# Patient Record
Sex: Male | Born: 2000 | Race: Black or African American | Hispanic: No | Marital: Single | Smoking: Never smoker
Health system: Southern US, Community
[De-identification: ages and names within clinical notes are randomized; demographics above are authoritative.]

## PROBLEM LIST (undated history)

## (undated) DIAGNOSIS — J45909 Unspecified asthma, uncomplicated: Secondary | ICD-10-CM

## (undated) DIAGNOSIS — T7840XA Allergy, unspecified, initial encounter: Secondary | ICD-10-CM

## (undated) HISTORY — PX: OTHER SURGICAL HISTORY: SHX169

---
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
## 2018-06-02 ENCOUNTER — Ambulatory Visit (HOSPITAL_COMMUNITY)
Admission: EM | Admit: 2018-06-02 | Discharge: 2018-06-02 | Disposition: A | Discharge status: Home / Self Care | Payer: BLUE CROSS/BLUE SHIELD | Attending: Family Medicine | Admitting: Family Medicine

## 2018-06-02 ENCOUNTER — Encounter (HOSPITAL_COMMUNITY): Payer: Self-pay

## 2018-06-02 DIAGNOSIS — R079 Chest pain, unspecified: Secondary | ICD-10-CM

## 2018-06-02 MED ORDER — IBUPROFEN 600 MG PO TABS: 600.0000 mg | ORAL_TABLET | Freq: Four times a day (QID) | ORAL | 0 refills | Status: DC | PRN

## 2018-06-02 NOTE — ED Triage Notes (Signed)
Pt presents with left side chest pain not associated with anything in particular.

## 2018-06-02 NOTE — Discharge Instructions (Addendum)
Your EKG was normal Exam normal.  No concerning signs or symptoms. Vital signs were normal I believe this could be a deep muscle pain. Ibuprofen 600 mg every 6 hours as needed Follow up as needed for continued or worsening symptoms

## 2018-06-03 NOTE — ED Provider Notes (Signed)
[facility]    CSN: [id number] Arrival date & time: 06/02/18  1508     History   Chief Complaint Chief Complaint  Patient presents with  . Chest Pain    Left    HPI Jeffrey Drake is a 17 y.o. male.   Patient is a 17 year old male past medical history of allergies and asthma.  He presents with approximately 2 days of left-sided chest pain.  The pain is waxing and waning.  He describes it as needles in his chest.  The pain is worse is about a 5 out of 10.  He denies any associated shortness of breath, cough, congestion, fevers.  He does have a history of asthma but has not had problems with this long time.  He is not currently using inhaler.  He denies any injury to the chest, strenuous activity, heavy lifting.  He denies any stress or anxiety.  The pain is not reproducible.  The pain is not affected by breathing.  He denies any nausea, vomiting or diaphoresis. He does not smoke.  No recent traveling.  He denies any history of PE.  ROS per HPI       Past Medical History:  Diagnosis Date  . Allergy   . Asthma     Patient Active Problem List   Diagnosis Date Noted  . Right tibial fracture 02/18/2015  . Tibial plateau fracture 04/15/2014    Past Surgical History:  Procedure Laterality Date  . OPEN REDUCTION INTERNAL FIXATION (ORIF) TIBIAL TUBERCLE Right 02/18/2015   Procedure: OPEN REDUCTION INTERNAL FIXATION (ORIF) TIBIAL TUBERCLE;  Surgeon: Eldred MangesMark C Yates, MD;  Location: [facility];  Service: Orthopedics;  Laterality: Right;  . testicular torsion     Mother says 2-3 weeks ago surgury on scrotum        Home Medications    Prior to Admission medications   Medication Sig Start Date End Date Taking? Authorizing Provider  albuterol (PROVENTIL HFA;VENTOLIN HFA) 108 (90 BASE) MCG/ACT inhaler Inhale 2 puffs into the lungs every 4 (four) hours as needed for wheezing or shortness of breath.    [provider]  ibuprofen (ADVIL,MOTRIN) 600 MG tablet Take 1  tablet (600 mg total) by mouth every 6 (six) hours as needed. 06/02/18   Dahlia ByesBast, Rivaldo Hineman A, NP  oxyCODONE-acetaminophen (ROXICET) 5-325 MG per tablet Take 1 tablet by mouth every 6 (six) hours as needed for severe pain. 02/20/15   Naida Sleightwens, James M, PA-C    Family History Family History  Problem Relation Age of Onset  . Asthma Mother   . Asthma Maternal Grandmother   . Diabetes Maternal Grandmother   . Hypertension Maternal Grandmother     Social History Social History   Tobacco Use  . Smoking status: Never Smoker  . Smokeless tobacco: Never Used  Substance Use Topics  . Alcohol use: No  . Drug use: No     Allergies   Shellfish allergy   Review of Systems Review of Systems   Physical Exam Triage Vital Signs ED Triage Vitals  Enc Vitals Group     BP 06/02/18 1614 (!) 123/55     Pulse Rate 06/02/18 1614 91     Resp 06/02/18 1614 18     Temp 06/02/18 1614 98.5 F (36.9 C)     Temp Source 06/02/18 1614 Oral     SpO2 06/02/18 1614 99 %     Weight --      Height --      Head Circumference --  Peak Flow --      Pain Score 06/02/18 1613 5     Pain Loc --      Pain Edu? --      Excl. in GC? --    No data found.  Updated Vital Signs BP (!) 123/55 (BP Location: Right Arm)   Pulse 91   Temp 98.5 F (36.9 C) (Oral)   Resp 18   SpO2 99%   Visual Acuity Right Eye Distance:   Left Eye Distance:   Bilateral Distance:    Right Eye Near:   Left Eye Near:    Bilateral Near:     Physical Exam  Constitutional: He is oriented to person, place, and time. He appears well-developed and well-nourished.  Non-toxic appearance. He does not appear ill.  HENT:  Head: Normocephalic and atraumatic.  Neck: Normal range of motion.  Cardiovascular: Normal rate, regular rhythm and normal pulses. Exam reveals no S3 and no S4.  No murmur heard. Pulmonary/Chest: Effort normal and breath sounds normal. No respiratory distress.  Musculoskeletal: Normal range of motion.       Right  lower leg: Normal.       Left lower leg: Normal.  Neurological: He is alert and oriented to person, place, and time.  Skin: Skin is warm and dry.  Psychiatric: He has a normal mood and affect.  Nursing note and vitals reviewed.    UC Treatments / Results  Labs (all labs ordered are listed, but only abnormal results are displayed) Labs Reviewed - No data to display  EKG None  Radiology No results found.  Procedures Procedures (including critical care time)  Medications Ordered in UC Medications - No data to display  Initial Impression / Assessment and Plan / UC Course  I have reviewed the triage vital signs and the nursing notes.  Pertinent labs & imaging results that were available during my care of the patient were reviewed by me and considered in my medical decision making (see chart for details).     Patient is a 17 year old male presents for left-sided chest pain that has been waxing and waning of the last 3 days. No concerning signs or symptoms on exam.  No concern for PE, cardiac arrhythmia, bronchitis, pneumonia.  He is otherwise healthy. EKG revealed normal sinus rhythm with normal rate. This is most likely a deep muscular skeletal pain Instructed to try ibuprofen 600 mg every 6 hours as needed For continued or worsening symptoms follow-up with primary care or go to the ER Final Clinical Impressions(s) / UC Diagnoses   Final diagnoses:  Nonspecific chest pain     Discharge Instructions     Your EKG was normal Exam normal.  No concerning signs or symptoms. Vital signs were normal I believe this could be a deep muscle pain. Ibuprofen 600 mg every 6 hours as needed Follow up as needed for continued or worsening symptoms     ED Prescriptions    Medication Sig Dispense Auth. Provider   ibuprofen (ADVIL,MOTRIN) 600 MG tablet Take 1 tablet (600 mg total) by mouth every 6 (six) hours as needed. 30 tablet Dahlia Byes A, NP     Controlled Substance  Prescriptions [place] Controlled Substance Registry consulted? Not Applicable   Janace Aris, NP 06/03/18 1027

## 2019-08-08 ENCOUNTER — Ambulatory Visit: Payer: Managed Care, Other (non HMO) | Attending: Internal Medicine

## 2019-08-08 DIAGNOSIS — Z20822 Contact with and (suspected) exposure to covid-19: Secondary | ICD-10-CM

## 2019-08-09 LAB — NOVEL CORONAVIRUS, NAA: SARS-CoV-2, NAA: DETECTED — AB

## 2019-08-15 ENCOUNTER — Ambulatory Visit: Payer: Managed Care, Other (non HMO) | Attending: Internal Medicine

## 2019-08-16 ENCOUNTER — Ambulatory Visit: Payer: Managed Care, Other (non HMO) | Attending: Internal Medicine

## 2019-08-16 DIAGNOSIS — Z20822 Contact with and (suspected) exposure to covid-19: Secondary | ICD-10-CM

## 2019-08-17 LAB — NOVEL CORONAVIRUS, NAA: SARS-CoV-2, NAA: NOT DETECTED

## 2019-12-14 ENCOUNTER — Emergency Department (HOSPITAL_COMMUNITY)
Admission: EM | Admit: 2019-12-14 | Discharge: 2019-12-14 | Disposition: A | Discharge status: Home / Self Care | Payer: Managed Care, Other (non HMO) | Attending: Emergency Medicine | Admitting: Emergency Medicine

## 2019-12-14 ENCOUNTER — Encounter (HOSPITAL_COMMUNITY): Payer: Self-pay | Admitting: Emergency Medicine

## 2019-12-14 DIAGNOSIS — K0401 Reversible pulpitis: Secondary | ICD-10-CM | POA: Insufficient documentation

## 2019-12-14 DIAGNOSIS — J45909 Unspecified asthma, uncomplicated: Secondary | ICD-10-CM | POA: Diagnosis not present

## 2019-12-14 DIAGNOSIS — K0889 Other specified disorders of teeth and supporting structures: Secondary | ICD-10-CM | POA: Diagnosis present

## 2019-12-14 MED ORDER — NAPROXEN 375 MG PO TABS: 375.0000 mg | ORAL_TABLET | Freq: Two times a day (BID) | ORAL | 0 refills | Status: DC

## 2019-12-14 MED ORDER — AMOXICILLIN 500 MG PO CAPS
500.0000 mg | ORAL_CAPSULE | Freq: Once | ORAL | Status: AC
  Administered 2019-12-14: 500 mg via ORAL
  Filled 2019-12-14: qty 1

## 2019-12-14 MED ORDER — IBUPROFEN 200 MG PO TABS
600.0000 mg | ORAL_TABLET | Freq: Once | ORAL | Status: AC
  Administered 2019-12-14: 600 mg via ORAL
  Filled 2019-12-14: qty 1

## 2019-12-14 MED ORDER — AMOXICILLIN 500 MG PO CAPS: 500.0000 mg | ORAL_CAPSULE | Freq: Two times a day (BID) | ORAL | 0 refills | Status: AC

## 2019-12-14 NOTE — ED Triage Notes (Signed)
Pt states for he has been having left sided dental pain for over 1 week made a dentist apt and they could not see him until sept. Pt has been using a numbing gel for pain.

## 2019-12-14 NOTE — ED Provider Notes (Signed)
[facility] EMERGENCY DEPARTMENT Provider Note   CSN: [id number] Arrival date & time: 12/14/19  0800     History Chief Complaint  Patient presents with  . Dental Pain    Jeffrey Drake is a 19 y.o. male.  HPI 19 year old male presents with left-sided dental pain.  Ongoing for 1-2 weeks.  Does not remember a specific injury or time that it exactly started.  No fevers, neck swelling, dental or facial swelling.  No trouble breathing.  Has tried aspirin and a topical cream which seemed to help at the first but now it is not really helping.  Called the dentist and has appointment set up for September.  Pain is both mandibular and maxillary.   Past Medical History:  Diagnosis Date  . Allergy   . Asthma     Patient Active Problem List   Diagnosis Date Noted  . Right tibial fracture 02/18/2015  . Tibial plateau fracture 04/15/2014    Past Surgical History:  Procedure Laterality Date  . OPEN REDUCTION INTERNAL FIXATION (ORIF) TIBIAL TUBERCLE Right 02/18/2015   Procedure: OPEN REDUCTION INTERNAL FIXATION (ORIF) TIBIAL TUBERCLE;  Surgeon: Eldred Manges, MD;  Location: [facility];  Service: Orthopedics;  Laterality: Right;  . testicular torsion     Mother says 2-3 weeks ago surgury on scrotum        Family History  Problem Relation Age of Onset  . Asthma Mother   . Asthma Maternal Grandmother   . Diabetes Maternal Grandmother   . Hypertension Maternal Grandmother     Social History   Tobacco Use  . Smoking status: Never Smoker  . Smokeless tobacco: Never Used  Substance Use Topics  . Alcohol use: No  . Drug use: No    Home Medications Prior to Admission medications   Medication Sig Start Date End Date Taking? Authorizing Provider  albuterol (PROVENTIL HFA;VENTOLIN HFA) 108 (90 BASE) MCG/ACT inhaler Inhale 2 puffs into the lungs every 4 (four) hours as needed for wheezing or shortness of breath.    [provider]  amoxicillin (AMOXIL) 500 MG  capsule Take 1 capsule (500 mg total) by mouth 2 (two) times daily for 7 days. 12/14/19 12/21/19  Pricilla Loveless, MD  ibuprofen (ADVIL,MOTRIN) 600 MG tablet Take 1 tablet (600 mg total) by mouth every 6 (six) hours as needed. 06/02/18   Dahlia Byes A, NP  naproxen (NAPROSYN) 375 MG tablet Take 1 tablet (375 mg total) by mouth 2 (two) times daily. 12/14/19   Pricilla Loveless, MD  oxyCODONE-acetaminophen (ROXICET) 5-325 MG per tablet Take 1 tablet by mouth every 6 (six) hours as needed for severe pain. 02/20/15   Naida Sleight, PA-C    Allergies    Shellfish allergy  Review of Systems   Review of Systems  Constitutional: Negative for fever.  HENT: Positive for dental problem.   Respiratory: Negative for shortness of breath.   Gastrointestinal: Negative for vomiting.  Musculoskeletal: Negative for neck pain and neck stiffness.    Physical Exam Updated Vital Signs BP 133/69 (BP Location: Left Arm)   Pulse 64   Temp 98.7 F (37.1 C) (Oral)   Resp 14   Ht 6\' 2"  (1.88 m)   Wt 102.1 kg   SpO2 100%   BMI 28.89 kg/m   Physical Exam Vitals and nursing note reviewed.  Constitutional:      General: He is not in acute distress.    Appearance: He is well-developed. He is not ill-appearing or diaphoretic.  HENT:     Head: Normocephalic and atraumatic.     Right Ear: External ear normal.     Left Ear: External ear normal.     Nose: Nose normal.     Mouth/Throat:      Comments: No obvious dental abscess or gingival swelling Eyes:     General:        Right eye: No discharge.        Left eye: No discharge.  Cardiovascular:     Rate and Rhythm: Normal rate and regular rhythm.     Heart sounds: Normal heart sounds.  Pulmonary:     Effort: Pulmonary effort is normal.     Breath sounds: Normal breath sounds.  Abdominal:     General: There is no distension.  Musculoskeletal:     Cervical back: Normal range of motion and neck supple. No rigidity or tenderness.  Skin:    General: Skin is  warm and dry.  Neurological:     Mental Status: He is alert.  Psychiatric:        Mood and Affect: Mood is not anxious.     ED Results / Procedures / Treatments   Labs (all labs ordered are listed, but only abnormal results are displayed) Labs Reviewed - No data to display  EKG None  Radiology No results found.  Procedures Procedures (including critical care time)  Medications Ordered in ED Medications  ibuprofen (ADVIL) tablet 600 mg (has no administration in time range)  amoxicillin (AMOXIL) capsule 500 mg (has no administration in time range)    ED Course  I have reviewed the triage vital signs and the nursing notes.  Pertinent labs & imaging results that were available during my care of the patient were reviewed by me and considered in my medical decision making (see chart for details).    MDM Rules/Calculators/A&P                      Given acute nature of his pain, will treat with antibiotics and anti-inflammatories.  Will refer to on-call dentistry to see if he can get an earlier appointment for what is probably pulpitis.  Otherwise, highly doubt deep space infection. Final Clinical Impression(s) / ED Diagnoses Final diagnoses:  Acute pulpitis    Rx / DC Orders ED Discharge Orders         Ordered    amoxicillin (AMOXIL) 500 MG capsule  2 times daily     12/14/19 0825    naproxen (NAPROSYN) 375 MG tablet  2 times daily     12/14/19 0825           Sherwood Gambler, MD 12/14/19 [phone]

## 2019-12-14 NOTE — Discharge Instructions (Signed)
If you develop fever, vomiting, neck or facial swelling, trouble breathing, or any other new/concerning symptoms then return to the ER for evaluation.  You are being prescribed Naproxen. Do not take Ibuprofen/Advil/Aleve/Motrin/Goody Powders/Naproxen/BC powders/Meloxicam/Diclofenac/Indomethacin and other Nonsteroidal anti-inflammatory medications

## 2021-05-07 ENCOUNTER — Emergency Department (HOSPITAL_BASED_OUTPATIENT_CLINIC_OR_DEPARTMENT_OTHER): Payer: No Typology Code available for payment source | Admitting: Radiology

## 2021-05-07 ENCOUNTER — Other Ambulatory Visit: Payer: Self-pay

## 2021-05-07 ENCOUNTER — Encounter (HOSPITAL_BASED_OUTPATIENT_CLINIC_OR_DEPARTMENT_OTHER): Payer: Self-pay

## 2021-05-07 DIAGNOSIS — S99922A Unspecified injury of left foot, initial encounter: Secondary | ICD-10-CM | POA: Diagnosis present

## 2021-05-07 DIAGNOSIS — X501XXA Overexertion from prolonged static or awkward postures, initial encounter: Secondary | ICD-10-CM | POA: Diagnosis not present

## 2021-05-07 DIAGNOSIS — J45909 Unspecified asthma, uncomplicated: Secondary | ICD-10-CM | POA: Insufficient documentation

## 2021-05-07 DIAGNOSIS — Y990 Civilian activity done for income or pay: Secondary | ICD-10-CM | POA: Diagnosis not present

## 2021-05-07 DIAGNOSIS — S99929A Unspecified injury of unspecified foot, initial encounter: Secondary | ICD-10-CM

## 2021-05-07 DIAGNOSIS — S93402A Sprain of unspecified ligament of left ankle, initial encounter: Secondary | ICD-10-CM | POA: Insufficient documentation

## 2021-05-07 NOTE — ED Triage Notes (Signed)
Patient BIB [facility] from Work Site with Left Ankle Injury.  Patient was at Work when he rolled his Left Ankle on a Pallet.   Patient has Pain mainly to Medial Ankle. Pulses Palpable. NAD noted during Triage. A&Ox4. GCS 15. BIB Wheelchair.

## 2021-05-08 ENCOUNTER — Emergency Department (HOSPITAL_BASED_OUTPATIENT_CLINIC_OR_DEPARTMENT_OTHER)
Admission: EM | Admit: 2021-05-08 | Discharge: 2021-05-08 | Disposition: A | Discharge status: Home / Self Care | Payer: No Typology Code available for payment source | Attending: Emergency Medicine | Admitting: Emergency Medicine

## 2021-05-08 DIAGNOSIS — S99929A Unspecified injury of unspecified foot, initial encounter: Secondary | ICD-10-CM

## 2021-05-08 DIAGNOSIS — S93402A Sprain of unspecified ligament of left ankle, initial encounter: Secondary | ICD-10-CM

## 2021-05-08 MED ORDER — NAPROXEN 250 MG PO TABS
500.0000 mg | ORAL_TABLET | Freq: Once | ORAL | Status: AC
  Administered 2021-05-08: 500 mg via ORAL
  Filled 2021-05-08: qty 2

## 2021-05-08 MED ORDER — NAPROXEN 375 MG PO TABS: ORAL_TABLET | ORAL | 0 refills | Status: DC

## 2021-05-08 NOTE — ED Provider Notes (Signed)
[facility] EMERGENC[facility] Provider Note: Lowella Dell, MD, FACEP  CSN: [id number] MRN: [id number] ARRIVAL: 05/07/21 at 2156 ROOM: DB007/DB007   CHIEF COMPLAINT  Ankle Injury   HISTORY OF PRESENT ILLNESS  05/08/21 4:24 AM Jeffrey Drake is a 20 y.o. male who was at work yesterday evening about 10 PM.  He stepped on a pallet and it broke causing him to invert his left ankle.  He is not having pain over the lateral malleolus of the left ankle.  He rates his pain as a 6 out of 10, worse with movement or palpation.   Past Medical History:  Diagnosis Date   Allergy    Asthma     Past Surgical History:  Procedure Laterality Date   OPEN REDUCTION INTERNAL FIXATION (ORIF) TIBIAL TUBERCLE Right 02/18/2015   Procedure: OPEN REDUCTION INTERNAL FIXATION (ORIF) TIBIAL TUBERCLE;  Surgeon: Eldred Manges, MD;  Location: [facility];  Service: Orthopedics;  Laterality: Right;   testicular torsion     Mother says 2-3 weeks ago surgury on scrotum     Family History  Problem Relation Age of Onset   Asthma Mother    Asthma Maternal Grandmother    Diabetes Maternal Grandmother    Hypertension Maternal Grandmother     Social History   Tobacco Use   Smoking status: Never   Smokeless tobacco: Never  Substance Use Topics   Alcohol use: No   Drug use: No    Prior to Admission medications   Medication Sig Start Date End Date Taking? Authorizing Provider  albuterol (PROVENTIL HFA;VENTOLIN HFA) 108 (90 BASE) MCG/ACT inhaler Inhale 2 puffs into the lungs every 4 (four) hours as needed for wheezing or shortness of breath.    [provider]  naproxen (NAPROSYN) 375 MG tablet Take 1 tablet twice daily as needed for pain. 05/08/21   Vandell Kun, MD    Allergies Shellfish allergy   REVIEW OF SYSTEMS  Negative except as noted here or in the History of Present Illness.   PHYSICAL EXAMINATION  Initial Vital Signs Blood pressure (!) 154/63, pulse 80, temperature 98.3 F (36.8 C), temperature  source Oral, resp. rate 16, height 6\' 2"  (1.88 m), weight 93 kg, SpO2 100 %.  Examination General: Well-developed, well-nourished male in no acute distress; appearance consistent with age of record HENT: normocephalic; atraumatic Eyes: Normal appearance Neck: supple Heart: regular rate and rhythm Lungs: clear to auscultation bilaterally Abdomen: soft; nondistended; nontender; bowel sounds present Extremities: No deformity; swelling and tenderness over left lateral malleolus, normal pulses, foot distally neurovascularly intact with intact tendon function Neurologic: Awake, alert and oriented; motor function intact in all extremities and symmetric; no facial droop Skin: Warm and dry Psychiatric: Normal mood and affect   RESULTS  Summary of this visit's results, reviewed and interpreted by myself:   EKG Interpretation  Date/Time:    Ventricular Rate:    PR Interval:    QRS Duration:   QT Interval:    QTC Calculation:   R Axis:     Text Interpretation:         Laboratory Studies: No results found for this or any previous visit (from the past 24 hour(s)). Imaging Studies: DG Ankle Complete Left  Result Date: 05/07/2021 CLINICAL DATA:  Ankle injury EXAM: LEFT ANKLE COMPLETE - 3+ VIEW COMPARISON:  04/15/2014 FINDINGS: No fracture or malalignment. Soft tissue swelling. Ankle mortise is symmetric. IMPRESSION: No acute osseous abnormality Electronically Signed   By: 06/15/2014 M.D.   On: 05/07/2021  23:12   DG Foot Complete Left  Result Date: 05/07/2021 CLINICAL DATA:  Ankle injury EXAM: LEFT FOOT - COMPLETE 3+ VIEW COMPARISON:  None. FINDINGS: No fracture or malalignment.  Soft tissues are unremarkable IMPRESSION: Negative. Electronically Signed   By: Jasmine Pang M.D.   On: 05/07/2021 23:07    ED COURSE and MDM  Nursing notes, initial and subsequent vitals signs, including pulse oximetry, reviewed and interpreted by myself.  Vitals:   05/07/21 2237 05/07/21 2239 05/08/21  0413  BP:  (!) 148/69 (!) 154/63  Pulse:  70 80  Resp:  12 16  Temp:  98.3 F (36.8 C)   TempSrc:  Oral   SpO2:  100% 100%  Weight: 93 kg    Height: 6\' 2"  (1.88 m)     Medications  naproxen (NAPROSYN) tablet 500 mg (has no administration in time range)    Presentation consistent with a sprain of the left ankle.  No evidence of fracture on radiograph.  Patient's foot is not tender on examination.  We will place an ASO.  PROCEDURES  Procedures   ED DIAGNOSES     ICD-10-CM   1. Sprain of left ankle, unspecified ligament, initial encounter                      H37.169C, MD 05/08/21 [phone]

## 2021-12-04 ENCOUNTER — Emergency Department (HOSPITAL_COMMUNITY): Payer: 59

## 2021-12-04 ENCOUNTER — Encounter (HOSPITAL_COMMUNITY): Payer: Self-pay

## 2021-12-04 ENCOUNTER — Other Ambulatory Visit: Payer: Self-pay

## 2021-12-04 ENCOUNTER — Emergency Department (HOSPITAL_COMMUNITY)
Admission: EM | Admit: 2021-12-04 | Discharge: 2021-12-04 | Disposition: A | Discharge status: Home / Self Care | Payer: 59 | Attending: Emergency Medicine | Admitting: Emergency Medicine

## 2021-12-04 DIAGNOSIS — Y92410 Unspecified street and highway as the place of occurrence of the external cause: Secondary | ICD-10-CM | POA: Diagnosis not present

## 2021-12-04 DIAGNOSIS — S8002XA Contusion of left knee, initial encounter: Secondary | ICD-10-CM | POA: Diagnosis not present

## 2021-12-04 DIAGNOSIS — S8992XA Unspecified injury of left lower leg, initial encounter: Secondary | ICD-10-CM | POA: Diagnosis present
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
## 2021-12-04 NOTE — ED Notes (Signed)
All discharge instructions including follow up care reviewed with patient and patient verbalized understanding of same. This RN applied knee immobilizer to patient's left knee prior to patient being discharged. Patient tolerated well. Wheel chair provided to patient prior to leaving department. Patient stable at time of discharge.
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
## 2022-04-30 ENCOUNTER — Emergency Department (HOSPITAL_COMMUNITY): Payer: 59

## 2022-04-30 ENCOUNTER — Other Ambulatory Visit: Payer: Self-pay

## 2022-04-30 ENCOUNTER — Emergency Department (HOSPITAL_COMMUNITY)
Admission: EM | Admit: 2022-04-30 | Discharge: 2022-04-30 | Disposition: A | Discharge status: Home / Self Care | Payer: 59 | Attending: Emergency Medicine | Admitting: Emergency Medicine

## 2022-04-30 ENCOUNTER — Encounter (HOSPITAL_COMMUNITY): Payer: Self-pay

## 2022-04-30 DIAGNOSIS — N179 Acute kidney failure, unspecified: Secondary | ICD-10-CM | POA: Diagnosis not present

## 2022-04-30 DIAGNOSIS — W51XXXA Accidental striking against or bumped into by another person, initial encounter: Secondary | ICD-10-CM | POA: Diagnosis not present

## 2022-04-30 DIAGNOSIS — S82012A Displaced osteochondral fracture of left patella, initial encounter for closed fracture: Secondary | ICD-10-CM | POA: Insufficient documentation

## 2022-04-30 DIAGNOSIS — M25562 Pain in left knee: Secondary | ICD-10-CM | POA: Diagnosis present

## 2022-04-30 DIAGNOSIS — Y9367 Activity, basketball: Secondary | ICD-10-CM | POA: Insufficient documentation

## 2022-04-30 LAB — I-STAT CHEM 8, ED
BUN: 17 mg/dL (ref 6–20)
Calcium, Ion: 1.14 mmol/L — ABNORMAL LOW (ref 1.15–1.40)
Chloride: 105 mmol/L (ref 98–111)
Creatinine, Ser: 1.3 mg/dL — ABNORMAL HIGH (ref 0.61–1.24)
Glucose, Bld: 99 mg/dL (ref 70–99)
HCT: 45 % (ref 39.0–52.0)
Hemoglobin: 15.3 g/dL (ref 13.0–17.0)
Potassium: 4.3 mmol/L (ref 3.5–5.1)
Sodium: 139 mmol/L (ref 135–145)
TCO2: 26 mmol/L (ref 22–32)

## 2022-04-30 LAB — CBC WITH DIFFERENTIAL/PLATELET
Abs Immature Granulocytes: 0.03 10*3/uL (ref 0.00–0.07)
Basophils Absolute: 0 10*3/uL (ref 0.0–0.1)
Basophils Relative: 0 %
Eosinophils Absolute: 0.1 10*3/uL (ref 0.0–0.5)
Eosinophils Relative: 1 %
HCT: 44.6 % (ref 39.0–52.0)
Hemoglobin: 14.3 g/dL (ref 13.0–17.0)
Immature Granulocytes: 0 %
Lymphocytes Relative: 19 %
Lymphs Abs: 1.5 10*3/uL (ref 0.7–4.0)
MCH: 28.3 pg (ref 26.0–34.0)
MCHC: 32.1 g/dL (ref 30.0–36.0)
MCV: 88.1 fL (ref 80.0–100.0)
Monocytes Absolute: 0.8 10*3/uL (ref 0.1–1.0)
Monocytes Relative: 9 %
Neutro Abs: 5.7 10*3/uL (ref 1.7–7.7)
Neutrophils Relative %: 71 %
Platelets: 161 10*3/uL (ref 150–400)
RBC: 5.06 MIL/uL (ref 4.22–5.81)
RDW: 13.6 % (ref 11.5–15.5)
WBC: 8.1 10*3/uL (ref 4.0–10.5)
nRBC: 0 % (ref 0.0–0.2)

## 2022-04-30 MED ORDER — IBUPROFEN 600 MG PO TABS: 600.0000 mg | ORAL_TABLET | Freq: Four times a day (QID) | ORAL | 0 refills | Status: AC | PRN

## 2022-04-30 MED ORDER — SODIUM CHLORIDE 0.9 % IV BOLUS
1000.0000 mL | Freq: Once | INTRAVENOUS | Status: AC
  Administered 2022-04-30: 1000 mL via INTRAVENOUS

## 2022-04-30 MED ORDER — IOHEXOL 350 MG/ML SOLN
100.0000 mL | Freq: Once | INTRAVENOUS | Status: AC | PRN
  Administered 2022-04-30: 100 mL via INTRAVENOUS

## 2022-04-30 MED ORDER — SODIUM CHLORIDE (PF) 0.9 % IJ SOLN
INTRAMUSCULAR | Status: AC
  Filled 2022-04-30: qty 50

## 2022-04-30 MED ORDER — IBUPROFEN 800 MG PO TABS
800.0000 mg | ORAL_TABLET | Freq: Once | ORAL | Status: AC
  Administered 2022-04-30: 800 mg via ORAL
  Filled 2022-04-30: qty 1

## 2022-04-30 NOTE — Discharge Instructions (Addendum)
You have been evaluated for your left knee injury.  It is likely that you have experienced an osteochondral fracture of the left knee.  Wear knee sleeve for support, use crutches to help you ambulate, call and follow-up closely with orthopedist doctor for further managements of your condition.  You may take ibuprofen as needed for pain.  Keep your knee elevated while at rest and continue to ice for comfort.
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
## 2022-04-30 NOTE — ED Provider Notes (Signed)
[facility]-EMERGENCY DEPT Provider Note   CSN: [id number] Arrival date & time: 04/30/22  4098     History  Chief Complaint  Patient presents with   Knee Pain    Jeffrey Drake is a 21 y.o. male.  The history is provided by the patient and medical records. No language interpreter was used.  Knee Pain    21 year old male presenting complaining of knee pain.  Patient report he was playing basketball last night when someone jumped on his back and he felt an immediate pain to his left knee.  Reported feeling a pop and since then he has difficulty bearing weight on the affected knee.  Pain is sharp throbbing worsening with extension and flexing of his knee.  Pain is nonradiating.  No associated hip or ankle pain.  No numbness.  He is having trouble bearing weight.  No specific treatment tried.  Home Medications Prior to Admission medications   Medication Sig Start Date End Date Taking? Authorizing Provider  albuterol (PROVENTIL HFA;VENTOLIN HFA) 108 (90 BASE) MCG/ACT inhaler Inhale 2 puffs into the lungs every 4 (four) hours as needed for wheezing or shortness of breath.    [provider]  naproxen (NAPROSYN) 375 MG tablet Take 1 tablet twice daily as needed for pain. 05/08/21   Molpus, Jonny Ruiz, MD      Allergies    Shellfish allergy    Review of Systems   Review of Systems  All other systems reviewed and are negative.   Physical Exam Updated Vital Signs BP 133/82 (BP Location: Left Arm)   Pulse 72   Temp 97.8 F (36.6 C) (Oral)   Resp 15   Ht 6\' 3"  (1.905 m)   Wt 93 kg   SpO2 100%   BMI 25.62 kg/m  Physical Exam Vitals and nursing note reviewed.  Constitutional:      General: He is not in acute distress.    Appearance: He is well-developed.  HENT:     Head: Atraumatic.  Eyes:     Conjunctiva/sclera: Conjunctivae normal.  Musculoskeletal:        General: Tenderness (Left knee: Moderate edema appreciated about the knee with exquisite  tenderness throughout.  Patella is located.  Difficulty flexing and extending the knee secondary to pain.  No obvious joint laxity appreciated.) present.     Cervical back: Neck supple.  Skin:    Capillary Refill: Capillary refill takes less than 2 seconds.     Findings: No rash.  Neurological:     Mental Status: He is alert.     ED Results / Procedures / Treatments   Labs (all labs ordered are listed, but only abnormal results are displayed) Labs Reviewed  I-STAT CHEM 8, ED - Abnormal; Notable for the following components:      Result Value   Creatinine, Ser 1.30 (*)    Calcium, Ion 1.14 (*)    All other components within normal limits  CBC WITH DIFFERENTIAL/PLATELET    EKG None  Radiology CT Angio Aortobifemoral W and/or Wo Contrast  Result Date: 04/30/2022 CLINICAL DATA:  21 year old male status post knee dislocation with concern for vascular injury. EXAM: CT ANGIOGRAPHY OF ABDOMINAL AORTA WITH ILIOFEMORAL RUNOFF TECHNIQUE: Multidetector CT imaging of the abdomen, pelvis and lower extremities was performed using the standard protocol during bolus administration of intravenous contrast. Multiplanar CT image reconstructions and MIPs were obtained to evaluate the vascular anatomy. RADIATION DOSE REDUCTION: This exam was performed according to the departmental dose-optimization program which  includes automated exposure control, adjustment of the mA and/or kV according to patient size and/or use of iterative reconstruction technique. CONTRAST:  OMNIPAQUE IOHEXOL 350 MG/ML SOLN COMPARISON:  None Available. FINDINGS: VASCULAR Aorta: Normal caliber aorta without aneurysm, dissection, vasculitis or significant stenosis. Celiac: Patent without evidence of aneurysm, dissection, vasculitis or significant stenosis. SMA: Patent without evidence of aneurysm, dissection, vasculitis or significant stenosis. Renals: Single bilateral renal arteries are patent without evidence of aneurysm,  dissection, vasculitis, fibromuscular dysplasia or significant stenosis. IMA: Patent without evidence of aneurysm, dissection, vasculitis or significant stenosis. RIGHT Lower Extremity Inflow: Common, internal and external iliac arteries are patent without evidence of aneurysm, dissection, vasculitis or significant stenosis. Outflow: Common, superficial and profunda femoral arteries and the popliteal artery are patent without evidence of aneurysm, dissection, vasculitis or significant stenosis. Just anterior to the P3 segment of the right popliteal artery is an ill-defined focus of hyperattenuation which in coronal was confirmed represent streak artifact from indwelling tibial screw. Runoff: Patent three vessel runoff to the ankle. LEFT Lower Extremity Inflow: Common, internal and external iliac arteries are patent without evidence of aneurysm, dissection, vasculitis or significant stenosis. Outflow: Common, superficial and profunda femoral arteries and the popliteal artery are patent without evidence of aneurysm, dissection, vasculitis or significant stenosis. Runoff: Patent three vessel runoff to the ankle. Veins: No obvious venous abnormality within the limitations of this arterial phase study. Review of the MIP images confirms the above findings. NON-VASCULAR Lower chest: No acute abnormality. Hepatobiliary: No focal liver abnormality is seen. No gallstones, gallbladder wall thickening, or biliary dilatation. Pancreas: Unremarkable. No pancreatic ductal dilatation or surrounding inflammatory changes. Spleen: Normal in size without focal abnormality. Adrenals/Urinary Tract: Adrenal glands are unremarkable. Kidneys are normal, without renal calculi, focal lesion, or hydronephrosis. Bladder is unremarkable. Stomach/Bowel: Stomach is within normal limits. Appendix is not definitively identified. No evidence of bowel wall thickening, distention, or inflammatory changes. Lymphatic: No abdominopelvic lymphadenopathy.  Reproductive: Prostate is unremarkable. Other: No abdominal wall hernia or abnormality. No abdominopelvic ascites. Musculoskeletal: No acute osseous abnormality. There are 3 anterior patellar tendon fixation screws within the proximal tibia without evidence of loosening, fracture, or malalignment. IMPRESSION: VASCULAR No evidence of lower extremity vascular injury. NON-VASCULAR No acute abdominopelvic abnormality. Marliss Coots, MD [facility] Radiology Electronically Signed   By: Marliss Coots M.D.   On: 04/30/2022 11:46   DG Knee Complete 4 Views Left  Result Date: 04/30/2022 CLINICAL DATA:  Basketball injury. Limited range of motion. Patient felt a pop. Severe pain. EXAM: LEFT KNEE - COMPLETE 4 VIEW COMPARISON:  None Available. FINDINGS: The knee is located. A large joint effusion is present. Focal osseous density within the effusion likely represents a small bone fragment. This most likely represents an osteochondral fragment related to a patellar dislocation injury. Well corticated area at the tibial tuberosity is chronic. No acute inflammation is present at that site. IMPRESSION: 1. Large joint effusion. 2. Focal osseous density within the effusion likely represents an osteochondral fragment related to a patellar dislocation injury. Electronically Signed   By: Marin Roberts M.D.   On: 04/30/2022 08:20    Procedures Procedures    Medications Ordered in ED Medications  ibuprofen (ADVIL) tablet 800 mg (800 mg Oral Given 04/30/22 0753)  sodium chloride 0.9 % bolus 1,000 mL (1,000 mLs Intravenous New Bag/Given 04/30/22 1047)  iohexol (OMNIPAQUE) 350 MG/ML injection 100 mL (100 mLs Intravenous Contrast Given 04/30/22 1000)  sodium chloride (PF) 0.9 % injection (  Given by Other 04/30/22 1155)    ED Course/ Medical Decision Making/ A&P                           Medical Decision Making Amount and/or Complexity of Data Reviewed Labs:  ordered. Radiology: ordered.  Risk Prescription drug management.   BP 133/82 (BP Location: Left Arm)   Pulse 72   Temp 97.8 F (36.6 C) (Oral)   Resp 15   Ht 6\' 3"  (1.905 m)   Wt 93 kg   SpO2 100%   BMI 25.62 kg/m   4:78 AM 21 year old male presenting complaining of knee pain.  Patient report he was playing basketball last night when someone jumped on his back and he felt an immediate pain to his left knee.  Reported feeling a pop and since then he has difficulty bearing weight on the affected knee.  Pain is sharp throbbing worsening with extension and flexing of his knee.  Pain is nonradiating.  No associated hip or ankle pain.  No numbness.  He is having trouble bearing weight.  No specific treatment tried.  On exam, this is a well-appearing male sitting in bed appears to be in no acute discomfort.  His left knee is slightly in a flexed position.  Moderate edema appreciated about the knee with exquisite tenderness to palpation.  Patella is located.  Difficulty extending and flexing his knee secondary to pain.  No obvious joint laxity.  No erythema or warmth appreciated.  Left hip and left ankle nontender.  Finding on physical exam is concerning for internal derangement of the knee.  X-ray of left knee ordered to rule out fracture or dislocation.  Ibuprofen given for pain.  Patient is neurovascular intact.  I reviewed patient's EMR and he has had prior right tibial plateau fracture requiring surgical repair by Dr. 30 in 2016.  9:41 AM X-ray of the left knee obtained independently viewed and interpreted by me and I agree with radiologist interpretation.  X-ray demonstrate a large joint effusion with focal osseous density within the effusion likely represents a osteochondral fragment related to a patellar dislocation injury.  Given the mechanism Injury, 1 concern is potential vascular injury.  After discussion with attending Dr. 2017, without will be reasonable to obtain a CT  angiogram of bilateral lower extremity to rule out vascular injury.  Patient does have intact distal pulses.  Labs remarkable for AKI with creatinine of 1.3, will give IV fluid as IV contrast can further worsen his renal function.  12:15 PM CT scan of the vasculature of the leg was obtained independently viewed interpreted by me and without any evidence of extremity vascular injury.  The CT was ordered to rule out popliteal artery dissection from the mechanisms of the injury.  Fortunately no evidence of such.  I discussed plan of care including knee immobilizer, crutches and also recommend elevating his knee at rest.  He will need to follow-up with his orthopedist Dr. Manus Gunning for outpatient care.  I have considered possible admission but in the setting of no vascular injury and patient has ability to ambulate with crutches he is stable to be discharged.  Patient report improvement of symptoms after receiving ibuprofen for pain.        Final Clinical Impression(s) / ED Diagnoses Final diagnoses:  Closed displaced osteochondral fracture of left patella, initial encounter  AKI (acute kidney injury) (HCC)    Rx / DC Orders ED Discharge Orders  Ordered    ibuprofen (ADVIL) 600 MG tablet  Every 6 hours PRN        04/30/22 1211              Domenic Moras, PA-C 04/30/22 1217    Ezequiel Essex, MD 04/30/22 1732

## 2022-05-27 ENCOUNTER — Ambulatory Visit (INDEPENDENT_AMBULATORY_CARE_PROVIDER_SITE_OTHER): Payer: Managed Care, Other (non HMO) | Admitting: Orthopaedic Surgery

## 2022-05-27 ENCOUNTER — Encounter: Payer: Self-pay | Admitting: Orthopaedic Surgery

## 2022-05-27 VITALS — BP 136/76 | HR 80 | Ht 74.0 in | Wt 205.0 lb

## 2022-05-27 DIAGNOSIS — S8392XA Sprain of unspecified site of left knee, initial encounter: Secondary | ICD-10-CM | POA: Diagnosis not present

## 2022-05-27 NOTE — Progress Notes (Signed)
Office Visit Note   Patient: Jeffrey Drake           Date of Birth: [birth year]           MRN: [id number] Visit Date: 05/27/2022              Requested by: No referring provider defined for this encounter. PCP: Patient, No Pcp Per   Assessment & Plan: Visit Diagnoses:  1. Traumatic hemarthrosis of left knee           With osteochondral fragment plain xray.   Plan: Work slip given no work for 4 weeks from today with first day of work he missed was 05/01/2022.  MRI scan left knee to evaluate him for donor site for the osteochondral fragment and assess ligamentous injury.  Office follow-up after scan for review.  Follow-Up Instructions: No follow-ups on file.   Orders:  No orders of the defined types were placed in this encounter.  No orders of the defined types were placed in this encounter.     Procedures: No procedures performed   Clinical Data: No additional findings.   Subjective: Chief Complaint  Patient presents with   Left Knee - Pain    DOI 04/29/2022    HPI 21 year old male seen with acute left knee injury.  He had surgery on his right knee with tibial tubercle avulsion fracture by me 2016 right knee is doing well.  Acute left knee injury occurred on 04/29/2022 when he was going up for a basket another player jumped on his back and was hanging onto him as he came down on his left leg felt a loud pop had extreme pain was unable to walk.  X-rays emergency room showed an osteochondral fragment with a large joint effusion.  He is placed in a knee immobilizer crutches and is not using the crutches currently but still using the knee immobilizer.  Had a large joint effusion and osteochondral fragment centimeter in the suprapatellar pouch.  Initial diagnosis was likely patellar subluxation with osteochondral injury.  He is use ice elevation.  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  Review of Systems all other systems noncontributory.   Objective: Vital Signs: BP 136/76   Pulse 80   Ht 6\' 2"  (1.88 m)   Wt 205 lb (93 kg)   BMI 26.32 kg/m   Physical Exam Constitutional:      Appearance: He is well-developed.  HENT:     Head: Normocephalic and atraumatic.     Right Ear: External ear normal.     Left Ear: External ear normal.  Eyes:     Pupils: Pupils are equal, round, and reactive to light.  Neck:     Thyroid: No thyromegaly.     Trachea: No tracheal deviation.  Cardiovascular:     Rate and Rhythm: Normal rate.  Pulmonary:     Effort: Pulmonary effort is normal.     Breath sounds: No wheezing.  Abdominal:     General: Bowel sounds are normal.     Palpations: Abdomen is soft.  Musculoskeletal:     Cervical back: Neck supple.  Skin:    General: Skin is warm and dry.     Capillary Refill: Capillary refill takes less than 2 seconds.  Neurological:     Mental Status: He is alert and oriented to person, place,  and time.  Psychiatric:        Behavior: Behavior normal.        Thought Content: Thought content normal.        Judgment: Judgment normal.     Ortho Exam 3+ knee hemarthrosis/effusion.  Collateral ligaments are stable negative Lachman negative anterior drawer he can flex to 90 degrees.  Pain with patellofemoral loading.  No tenderness over the medial patellofemoral ligament.  Some pain with lateral patellar subluxation.  Distal pulses intact no calf tenderness.  Specialty Comments:  No specialty comments available.  Imaging: Narrative & Impression  CLINICAL DATA:  Basketball injury. Limited range of motion. Patient felt a pop. Severe pain.   EXAM: LEFT KNEE - COMPLETE 4 VIEW   COMPARISON:  None Available.   FINDINGS: The knee is located. A large joint effusion is present. Focal osseous density within the effusion likely represents a small bone fragment. This most likely represents an osteochondral  fragment related to a patellar dislocation injury.   Well corticated area at the tibial tuberosity is chronic. No acute inflammation is present at that site.   IMPRESSION: 1. Large joint effusion. 2. Focal osseous density within the effusion likely represents an osteochondral fragment related to a patellar dislocation injury.     Electronically Signed   By: Marin Roberts M.D.   On: 04/30/2022 08:20     PMFS History: Patient Active Problem List   Diagnosis Date Noted   Traumatic hemarthrosis of left knee 05/27/2022   Right tibial fracture 02/18/2015   Tibial plateau fracture 04/15/2014   Past Medical History:  Diagnosis Date   Allergy    Asthma     Family History  Problem Relation Age of Onset   Asthma Mother    Asthma Maternal Grandmother    Diabetes Maternal Grandmother    Hypertension Maternal Grandmother     Past Surgical History:  Procedure Laterality Date   OPEN REDUCTION INTERNAL FIXATION (ORIF) TIBIAL TUBERCLE Right 02/18/2015   Procedure: OPEN REDUCTION INTERNAL FIXATION (ORIF) TIBIAL TUBERCLE;  Surgeon: Eldred Manges, MD;  Location: [facility] OR;  Service: Orthopedics;  Laterality: Right;   testicular torsion     Mother says 2-3 weeks ago surgury on scrotum    Social History   Occupational History   Not on file  Tobacco Use   Smoking status: Never   Smokeless tobacco: Never  Vaping Use   Vaping Use: Never used  Substance and Sexual Activity   Alcohol use: Yes    Comment: Socially   Drug use: No   Sexual activity: Never    Birth control/protection: Abstinence

## 2022-06-25 ENCOUNTER — Telehealth: Payer: Self-pay | Admitting: Orthopaedic Surgery

## 2022-06-25 NOTE — Telephone Encounter (Signed)
Patient states he need a Dr. Note for an actual date to return not just listing time period. Please advise..[phone]

## 2022-06-26 ENCOUNTER — Telehealth: Payer: Self-pay | Admitting: Orthopaedic Surgery

## 2022-06-26 NOTE — Telephone Encounter (Signed)
Called pt mailbox is full could not leave message, pt has been contacted twice by [facility] to call back to schedule. Pt can call [phone] to schedule appt.

## 2022-06-26 NOTE — Telephone Encounter (Signed)
Called pt back gave number to [facility] to call to schedul

## 2022-06-26 NOTE — Telephone Encounter (Signed)
Patient states he would like to get his MRI done. Please advise..[phone]

## 2022-06-26 NOTE — Telephone Encounter (Signed)
Pt returning call... Pt stated that he need a Dr. Note that has an actual date because his job wont accept the note that he provided to them... Pt requesting callback

## 2022-06-26 NOTE — Telephone Encounter (Signed)
Called pt back gave number to [facility] to call to schedul
